# Patient Record
Sex: Female | Born: 1990 | Race: Black or African American | Hispanic: No | Marital: Single | Smoking: Current every day smoker
Health system: Southern US, Community
[De-identification: ages and names within clinical notes are randomized; demographics above are authoritative.]

## PROBLEM LIST (undated history)

## (undated) DIAGNOSIS — R011 Cardiac murmur, unspecified: Secondary | ICD-10-CM

---
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
## 2010-01-12 ENCOUNTER — Inpatient Hospital Stay (HOSPITAL_COMMUNITY): Admission: AD | Admit: 2010-01-12 | Discharge: 2010-01-12 | Payer: Self-pay | Admitting: Obstetrics & Gynecology

## 2011-02-22 LAB — URINALYSIS, ROUTINE W REFLEX MICROSCOPIC
Ketones, ur: NEGATIVE mg/dL
Nitrite: NEGATIVE
Protein, ur: NEGATIVE mg/dL

## 2011-02-22 LAB — FETAL FIBRONECTIN: Fetal Fibronectin: NEGATIVE

## 2011-03-07 LAB — URINALYSIS, ROUTINE W REFLEX MICROSCOPIC
Bilirubin Urine: NEGATIVE
Glucose, UA: NEGATIVE mg/dL
Ketones, ur: NEGATIVE mg/dL
Protein, ur: NEGATIVE mg/dL
Urobilinogen, UA: 1 mg/dL (ref 0.0–1.0)

## 2011-03-07 LAB — URINE MICROSCOPIC-ADD ON

## 2011-03-07 LAB — WET PREP, GENITAL
Trich, Wet Prep: NONE SEEN
Yeast Wet Prep HPF POC: NONE SEEN

## 2011-03-07 LAB — URINE CULTURE

## 2011-03-07 LAB — GC/CHLAMYDIA PROBE AMP, GENITAL
Chlamydia, DNA Probe: POSITIVE — AB
GC Probe Amp, Genital: NEGATIVE

## 2012-09-18 ENCOUNTER — Encounter (HOSPITAL_COMMUNITY): Payer: Self-pay | Admitting: Family Medicine

## 2012-09-18 ENCOUNTER — Emergency Department (HOSPITAL_COMMUNITY)
Admission: EM | Admit: 2012-09-18 | Discharge: 2012-09-18 | Disposition: A | Discharge status: Home / Self Care | Payer: BC Managed Care – PPO | Attending: Emergency Medicine | Admitting: Emergency Medicine

## 2012-09-18 DIAGNOSIS — R070 Pain in throat: Secondary | ICD-10-CM | POA: Insufficient documentation

## 2012-09-18 DIAGNOSIS — J029 Acute pharyngitis, unspecified: Secondary | ICD-10-CM

## 2012-09-18 DIAGNOSIS — F17200 Nicotine dependence, unspecified, uncomplicated: Secondary | ICD-10-CM | POA: Insufficient documentation

## 2012-09-18 DIAGNOSIS — R05 Cough: Secondary | ICD-10-CM | POA: Insufficient documentation

## 2012-09-18 DIAGNOSIS — R059 Cough, unspecified: Secondary | ICD-10-CM | POA: Insufficient documentation

## 2012-09-18 DIAGNOSIS — R1310 Dysphagia, unspecified: Secondary | ICD-10-CM | POA: Insufficient documentation

## 2012-09-18 DIAGNOSIS — J039 Acute tonsillitis, unspecified: Secondary | ICD-10-CM | POA: Insufficient documentation

## 2012-09-18 LAB — RAPID STREP SCREEN (MED CTR MEBANE ONLY): Streptococcus, Group A Screen (Direct): NEGATIVE

## 2012-09-18 NOTE — ED Provider Notes (Signed)
History   This chart was scribed for Richardean Canal, MD by Charolett Bumpers . The patient was seen in room TR07C/TR07C. Patient's care was started at 1305.   CSN: [id number] Arrival date & time 09/18/12  1236  First MD Initiated Contact with Patient 09/18/12 1305      Chief Complaint  Patient presents with  . Sore Throat    HPI Karen Boone is a 21 y.o. female who presents to the Emergency Department complaining of constant, moderate sore throat for the past 3 days. She reports associated mild cough and pain with swallowing. She denies any fevers, rhinorrhea. She denies any sick contacts. She denies any other medical problems. She reports a h/o strep throat with the last case a few years ago.   History reviewed. No pertinent past medical history.  History reviewed. No pertinent past surgical history.  No family history on file.  History  Substance Use Topics  . Smoking status: Current Every Day Smoker  . Smokeless tobacco: Not on file  . Alcohol Use: No    OB History    Grav Para Term Preterm Abortions TAB SAB Ect Mult Living                  Review of Systems  Constitutional: Negative for fever and chills.  HENT: Positive for sore throat and trouble swallowing. Negative for rhinorrhea.   Respiratory: Positive for cough. Negative for shortness of breath.   Gastrointestinal: Negative for nausea, vomiting and abdominal pain.  Neurological: Negative for weakness.  All other systems reviewed and are negative.    Allergies  Review of patient's allergies indicates no known allergies.  Home Medications  No current outpatient prescriptions on file.  BP 109/70  Pulse 84  Temp 98.1 F (36.7 C) (Oral)  Resp 18  SpO2 99%  LMP 09/13/2012  Physical Exam  Nursing note and vitals reviewed. Constitutional: She is oriented to person, place, and time. She appears well-developed and well-nourished. No distress.       NAD  HENT:  Head: Normocephalic and atraumatic.    Mouth/Throat: Oropharyngeal exudate present.       Tonsils are enlarged with minimal exudates.   Eyes: EOM are normal. Pupils are equal, round, and reactive to light.  Neck: Normal range of motion. Neck supple. No tracheal deviation present.  Cardiovascular: Normal rate, regular rhythm and normal heart sounds.   No murmur heard. Pulmonary/Chest: Effort normal and breath sounds normal. No respiratory distress. She has no wheezes.  Abdominal: Soft. She exhibits no distension.  Musculoskeletal: Normal range of motion. She exhibits no edema.  Lymphadenopathy:    She has no cervical adenopathy.  Neurological: She is alert and oriented to person, place, and time.  Skin: Skin is warm and dry.  Psychiatric: She has a normal mood and affect. Her behavior is normal.    ED Course  Procedures (including critical care time)  DIAGNOSTIC STUDIES: Oxygen Saturation is 99% on room air, normal by my interpretation.    COORDINATION OF CARE:  13:09-Discussed planned course of treatment with the patient including a rapid strep screen, who is agreeable at this time.    1. Sore throat   2. Tonsillitis       MDM  Karen Boone is a 21 y.o. female here with sore throat. Sore throat likely viral as the rapid strep neg. She has 1/4 Centor criteria with neg rapid strep so doesn't need abx. Recommend motrin, fluids, and salt water gargles.  Return instructions given.     This document was completed by the scribe at my direction and I have reviewed its accuracy. I have personally examined the patient and agrees with the above document.   Chaney Malling, MD     Richardean Canal, MD 09/18/12 1409

## 2012-09-18 NOTE — ED Notes (Signed)
Urine sample provided if needed. 

## 2012-09-18 NOTE — ED Notes (Signed)
Pt presents with 3 day h/o difficulty swallowing.  Pt denies any sick contact, is able to handle secretions.

## 2012-09-18 NOTE — ED Notes (Signed)
Pt sts sore throat x 3 days  

## 2013-08-26 ENCOUNTER — Inpatient Hospital Stay (HOSPITAL_COMMUNITY)
Admission: AD | Admit: 2013-08-26 | Discharge: 2013-08-27 | Disposition: A | Discharge status: Home / Self Care | Payer: BC Managed Care – PPO | Source: Ambulatory Visit | Attending: Obstetrics and Gynecology | Admitting: Obstetrics and Gynecology

## 2013-08-26 ENCOUNTER — Encounter (HOSPITAL_COMMUNITY): Payer: Self-pay

## 2013-08-26 DIAGNOSIS — R109 Unspecified abdominal pain: Secondary | ICD-10-CM | POA: Insufficient documentation

## 2013-08-26 DIAGNOSIS — K529 Noninfective gastroenteritis and colitis, unspecified: Secondary | ICD-10-CM

## 2013-08-26 DIAGNOSIS — K5289 Other specified noninfective gastroenteritis and colitis: Secondary | ICD-10-CM | POA: Insufficient documentation

## 2013-08-26 HISTORY — DX: Cardiac murmur, unspecified: R01.1

## 2013-08-27 DIAGNOSIS — K5289 Other specified noninfective gastroenteritis and colitis: Secondary | ICD-10-CM

## 2013-08-27 LAB — URINALYSIS, ROUTINE W REFLEX MICROSCOPIC
Bilirubin Urine: NEGATIVE
Ketones, ur: NEGATIVE mg/dL
Leukocytes, UA: NEGATIVE
Nitrite: NEGATIVE
Specific Gravity, Urine: >1.03 — ABNORMAL HIGH (ref 1.005–1.030)
Urobilinogen, UA: 0.2 mg/dL (ref 0.0–1.0)
pH: 6 (ref 5.0–8.0)

## 2013-08-27 LAB — CBC
HCT: 33.7 % — ABNORMAL LOW (ref 36.0–46.0)
MCH: 31.9 pg (ref 26.0–34.0)
MCV: 93.4 fL (ref 78.0–100.0)
RBC: 3.61 MIL/uL — ABNORMAL LOW (ref 3.87–5.11)
WBC: 8.3 10*3/uL (ref 4.0–10.5)

## 2013-08-27 LAB — URINE MICROSCOPIC-ADD ON

## 2013-08-27 LAB — WET PREP, GENITAL: WBC, Wet Prep HPF POC: NONE SEEN

## 2013-08-27 MED ORDER — ONDANSETRON HCL 4 MG PO TABS: 4.0000 mg | ORAL_TABLET | Freq: Three times a day (TID) | ORAL | Status: DC | PRN

## 2013-08-27 MED ORDER — KETOROLAC TROMETHAMINE 60 MG/2ML IM SOLN
60.0000 mg | Freq: Once | INTRAMUSCULAR | Status: AC
  Administered 2013-08-27: 60 mg via INTRAMUSCULAR
  Filled 2013-08-27: qty 2

## 2013-08-27 MED ORDER — ONDANSETRON 8 MG PO TBDP
8.0000 mg | ORAL_TABLET | Freq: Once | ORAL | Status: AC
  Administered 2013-08-27: 8 mg via ORAL
  Filled 2013-08-27: qty 1

## 2013-08-27 NOTE — MAU Provider Note (Signed)
Chief Complaint: Abdominal Pain   First Provider Initiated Contact with Patient 08/27/13 0038     SUBJECTIVE HPI: Karen Boone is a 22 y.o. G43P1001 female who presents with sharp, intermittent generalized abdominal cramping since yesterday (lower abdomen more than upper abdomen), vomited x3 yesterday and continued nausea today. Thinks she may have food poisoning. Denies consuming any unpasteurized dairy, undercooked meats. Patient's last menstrual period was 08/25/2013. Regular cycles. No history of similar pain. Has not taken anything for the pain due to concerns that it would exacerbate the nausea. Has not taken anything for nausea because she did not have any medication. No further vomiting since starting liquid diet this morning. Patient states she is in a mutually monogamous relationship x1 year.  Past Medical History  Diagnosis Date  . Heart murmur    OB History  Gravida Para Term Preterm AB SAB TAB Ectopic Multiple Living  1 1 1       1     # Outcome Date GA Lbr Len/2nd Weight Sex Delivery Anes PTL Lv  1 TRM 03/29/10 [redacted]w[redacted]d   M LTCS  N Y     History reviewed. No pertinent past surgical history. History   Social History  . Marital Status: Single    Spouse Name: N/A    Number of Children: N/A  . Years of Education: N/A   Occupational History  . Not on file.   Social History Main Topics  . Smoking status: Current Some Day Smoker    Types: Cigarettes  . Smokeless tobacco: Not on file  . Alcohol Use: Yes  . Drug Use: Yes    Special: Marijuana  . Sexual Activity: Yes    Birth Control/ Protection: None   Other Topics Concern  . Not on file   Social History Narrative  . No narrative on file   No current facility-administered medications on file prior to encounter.   No current outpatient prescriptions on file prior to encounter.   No Known Allergies  ROS: Pertinent positive items in HPI. Negative for fever, chills, diarrhea, constipation, sick contacts, urinary  complaints, vaginal discharge, intermenstrual bleeding. Last bowel movement yesterday.  OBJECTIVE Blood pressure 123/76, pulse 75, temperature 98.2 F (36.8 C), temperature source Oral, resp. rate 18, height 5\' 4"  (1.626 m), weight 77.928 kg (171 lb 12.8 oz), last menstrual period 08/25/2013. GENERAL: Well-developed, well-nourished female in no acute distress.  HEENT: Normocephalic HEART: normal rate RESP: normal effort ABDOMEN: Soft, mild-moderate tenderness throughout entire lower half of the abdomen. Negative guarding. Negative rebound. Negative mass. Positive bowel sounds. EXTREMITIES: Nontender, no edema NEURO: Alert and oriented SPECULUM EXAM: NEFG, small amount of dark red blood noted, normal odor. cervix clean BIMANUAL: cervix closed; uterus normal size, no adnexal tenderness or masses. No cervical motion tenderness.  LAB RESULTS Results for orders placed during the hospital encounter of 08/26/13 (from the past 24 hour(s))  URINALYSIS, ROUTINE W REFLEX MICROSCOPIC     Status: Abnormal   Collection Time    08/26/13 11:21 PM      Result Value Range   Color, Urine YELLOW  YELLOW   APPearance CLEAR  CLEAR   Specific Gravity, Urine >1.030 (*) 1.005 - 1.030   pH 6.0  5.0 - 8.0   Glucose, UA NEGATIVE  NEGATIVE mg/dL   Hgb urine dipstick MODERATE (*) NEGATIVE   Bilirubin Urine NEGATIVE  NEGATIVE   Ketones, ur NEGATIVE  NEGATIVE mg/dL   Protein, ur NEGATIVE  NEGATIVE mg/dL   Urobilinogen, UA 0.2  0.0 - 1.0 mg/dL   Nitrite NEGATIVE  NEGATIVE   Leukocytes, UA NEGATIVE  NEGATIVE  URINE MICROSCOPIC-ADD ON     Status: None   Collection Time    08/26/13 11:21 PM      Result Value Range   WBC, UA 0-2  <3 WBC/hpf   RBC / HPF 0-2  <3 RBC/hpf   Bacteria, UA RARE  RARE   Urine-Other MUCOUS PRESENT    POCT PREGNANCY, URINE     Status: None   Collection Time    08/26/13 11:35 PM      Result Value Range   Preg Test, Ur NEGATIVE  NEGATIVE  CBC     Status: Abnormal   Collection Time     08/27/13 12:40 AM      Result Value Range   WBC 8.3  4.0 - 10.5 K/uL   RBC 3.61 (*) 3.87 - 5.11 MIL/uL   Hemoglobin 11.5 (*) 12.0 - 15.0 g/dL   HCT 16.1 (*) 09.6 - 04.5 %   MCV 93.4  78.0 - 100.0 fL   MCH 31.9  26.0 - 34.0 pg   MCHC 34.1  30.0 - 36.0 g/dL   RDW 40.9  81.1 - 91.4 %   Platelets 163  150 - 400 K/uL  WET PREP, GENITAL     Status: Abnormal   Collection Time    08/27/13 12:45 AM      Result Value Range   Yeast Wet Prep HPF POC NONE SEEN  NONE SEEN   Trich, Wet Prep NONE SEEN  NONE SEEN   Clue Cells Wet Prep HPF POC FEW (*) NONE SEEN   WBC, Wet Prep HPF POC NONE SEEN  NONE SEEN    IMAGING No results found.  MAU COURSE Toradol and Zofran given. Pain and nausea resolved.   ASSESSMENT 1. Acute gastroenteritis    PLAN Discharge home in stable condition.     Follow-up Information   Follow up with Primary care provider. (As needed if no improvement in nausea and vomiting in the next 2-3 days.)       Follow up with COUSINS,SHERONETTE A, MD. (As needed for routine gynecology care)    Specialty:  Obstetrics and Gynecology   Contact information:   [address] [place] [address] [phone]        Medication List         ondansetron 4 MG tablet  Commonly known as:  ZOFRAN  Take 1 tablet (4 mg total) by mouth every 8 (eight) hours as needed for nausea.         [place], CNM 08/27/2013  2:15 AM

## 2013-08-28 LAB — GC/CHLAMYDIA PROBE AMP
CT Probe RNA: NEGATIVE
GC Probe RNA: NEGATIVE

## 2013-09-01 NOTE — MAU Provider Note (Signed)
Attestation of Attending Supervision of Advanced Practitioner: Evaluation and management procedures were performed by the PA/NP/CNM/OB Fellow under my supervision/collaboration. Chart reviewed and agree with management and plan.  Kaylah Chiasson V 09/01/2013 11:37 AM

## 2014-02-25 ENCOUNTER — Encounter (HOSPITAL_COMMUNITY): Payer: Self-pay | Admitting: Emergency Medicine

## 2014-02-25 DIAGNOSIS — R112 Nausea with vomiting, unspecified: Secondary | ICD-10-CM | POA: Insufficient documentation

## 2014-02-25 DIAGNOSIS — R1084 Generalized abdominal pain: Secondary | ICD-10-CM | POA: Insufficient documentation

## 2014-02-25 DIAGNOSIS — R011 Cardiac murmur, unspecified: Secondary | ICD-10-CM | POA: Insufficient documentation

## 2014-02-25 DIAGNOSIS — K5900 Constipation, unspecified: Secondary | ICD-10-CM | POA: Insufficient documentation

## 2014-02-25 DIAGNOSIS — F17200 Nicotine dependence, unspecified, uncomplicated: Secondary | ICD-10-CM | POA: Insufficient documentation

## 2014-02-25 LAB — CBC WITH DIFFERENTIAL/PLATELET
BASOS PCT: 0 % (ref 0–1)
Basophils Absolute: 0 10*3/uL (ref 0.0–0.1)
EOS ABS: 0.1 10*3/uL (ref 0.0–0.7)
Eosinophils Relative: 1 % (ref 0–5)
HEMATOCRIT: 34.9 % — AB (ref 36.0–46.0)
HEMOGLOBIN: 12.3 g/dL (ref 12.0–15.0)
Lymphocytes Relative: 25 % (ref 12–46)
Lymphs Abs: 2.3 10*3/uL (ref 0.7–4.0)
MCH: 33 pg (ref 26.0–34.0)
MCHC: 35.2 g/dL (ref 30.0–36.0)
MCV: 93.6 fL (ref 78.0–100.0)
MONO ABS: 0.4 10*3/uL (ref 0.1–1.0)
MONOS PCT: 5 % (ref 3–12)
Neutro Abs: 6.2 10*3/uL (ref 1.7–7.7)
Neutrophils Relative %: 69 % (ref 43–77)
Platelets: 173 10*3/uL (ref 150–400)
RBC: 3.73 MIL/uL — ABNORMAL LOW (ref 3.87–5.11)
RDW: 12.7 % (ref 11.5–15.5)
WBC: 8.9 10*3/uL (ref 4.0–10.5)

## 2014-02-25 LAB — POC URINE PREG, ED: PREG TEST UR: NEGATIVE

## 2014-02-25 LAB — COMPREHENSIVE METABOLIC PANEL
ALBUMIN: 3.8 g/dL (ref 3.5–5.2)
ALT: 10 U/L (ref 0–35)
AST: 14 U/L (ref 0–37)
Alkaline Phosphatase: 43 U/L (ref 39–117)
BILIRUBIN TOTAL: 0.5 mg/dL (ref 0.3–1.2)
BUN: 9 mg/dL (ref 6–23)
CO2: 25 mEq/L (ref 19–32)
CREATININE: 0.52 mg/dL (ref 0.50–1.10)
Calcium: 9.4 mg/dL (ref 8.4–10.5)
Chloride: 105 mEq/L (ref 96–112)
GFR calc non Af Amer: >90 mL/min (ref >90)
GLUCOSE: 110 mg/dL — AB (ref 70–99)
Potassium: 3.8 mEq/L (ref 3.7–5.3)
Sodium: 143 mEq/L (ref 137–147)
TOTAL PROTEIN: 7.1 g/dL (ref 6.0–8.3)

## 2014-02-25 LAB — URINALYSIS, ROUTINE W REFLEX MICROSCOPIC
Bilirubin Urine: NEGATIVE
Glucose, UA: NEGATIVE mg/dL
Hgb urine dipstick: NEGATIVE
Ketones, ur: NEGATIVE mg/dL
Nitrite: NEGATIVE
PROTEIN: NEGATIVE mg/dL
Specific Gravity, Urine: 1.029 (ref 1.005–1.030)
UROBILINOGEN UA: 0.2 mg/dL (ref 0.0–1.0)
pH: 7.5 (ref 5.0–8.0)

## 2014-02-25 LAB — LIPASE, BLOOD: LIPASE: 30 U/L (ref 11–59)

## 2014-02-25 LAB — URINE MICROSCOPIC-ADD ON

## 2014-02-25 NOTE — ED Notes (Signed)
Pt. reports generalized abdominal pain with nausea and vomitting onset last week . Slight constipation , no fever or chills.

## 2014-02-26 ENCOUNTER — Emergency Department (HOSPITAL_COMMUNITY): Payer: BC Managed Care – PPO

## 2014-02-26 ENCOUNTER — Emergency Department (HOSPITAL_COMMUNITY)
Admission: EM | Admit: 2014-02-26 | Discharge: 2014-02-26 | Disposition: A | Discharge status: Home / Self Care | Payer: BC Managed Care – PPO | Attending: Emergency Medicine | Admitting: Emergency Medicine

## 2014-02-26 ENCOUNTER — Emergency Department (HOSPITAL_COMMUNITY)
Admission: EM | Admit: 2014-02-26 | Discharge: 2014-02-26 | Discharge status: Against Medical Advice | Payer: BC Managed Care – PPO | Attending: Emergency Medicine | Admitting: Emergency Medicine

## 2014-02-26 ENCOUNTER — Encounter (HOSPITAL_COMMUNITY): Payer: Self-pay | Admitting: Emergency Medicine

## 2014-02-26 DIAGNOSIS — N760 Acute vaginitis: Secondary | ICD-10-CM | POA: Insufficient documentation

## 2014-02-26 DIAGNOSIS — B9689 Other specified bacterial agents as the cause of diseases classified elsewhere: Secondary | ICD-10-CM | POA: Insufficient documentation

## 2014-02-26 DIAGNOSIS — R109 Unspecified abdominal pain: Secondary | ICD-10-CM

## 2014-02-26 DIAGNOSIS — N83202 Unspecified ovarian cyst, left side: Secondary | ICD-10-CM

## 2014-02-26 DIAGNOSIS — N83209 Unspecified ovarian cyst, unspecified side: Secondary | ICD-10-CM | POA: Insufficient documentation

## 2014-02-26 DIAGNOSIS — F17200 Nicotine dependence, unspecified, uncomplicated: Secondary | ICD-10-CM | POA: Insufficient documentation

## 2014-02-26 DIAGNOSIS — R011 Cardiac murmur, unspecified: Secondary | ICD-10-CM | POA: Insufficient documentation

## 2014-02-26 DIAGNOSIS — Z3202 Encounter for pregnancy test, result negative: Secondary | ICD-10-CM | POA: Insufficient documentation

## 2014-02-26 DIAGNOSIS — A499 Bacterial infection, unspecified: Secondary | ICD-10-CM | POA: Insufficient documentation

## 2014-02-26 LAB — WET PREP, GENITAL
Trich, Wet Prep: NONE SEEN
YEAST WET PREP: NONE SEEN

## 2014-02-26 LAB — POC URINE PREG, ED: PREG TEST UR: NEGATIVE

## 2014-02-26 MED ORDER — METRONIDAZOLE 500 MG PO TABS: 500.0000 mg | ORAL_TABLET | Freq: Two times a day (BID) | ORAL | Status: DC

## 2014-02-26 MED ORDER — KETOROLAC TROMETHAMINE 30 MG/ML IJ SOLN
30.0000 mg | Freq: Once | INTRAMUSCULAR | Status: AC
  Administered 2014-02-26: 30 mg via INTRAVENOUS
  Filled 2014-02-26: qty 1

## 2014-02-26 MED ORDER — NAPROXEN 500 MG PO TABS: 500.0000 mg | ORAL_TABLET | Freq: Two times a day (BID) | ORAL | Status: DC

## 2014-02-26 MED ORDER — MORPHINE SULFATE 4 MG/ML IJ SOLN: 4.0000 mg | Freq: Once | INTRAMUSCULAR | Status: DC

## 2014-02-26 MED ORDER — SODIUM CHLORIDE 0.9 % IV BOLUS (SEPSIS)
1000.0000 mL | Freq: Once | INTRAVENOUS | Status: AC
  Administered 2014-02-26: 1000 mL via INTRAVENOUS

## 2014-02-26 NOTE — ED Notes (Signed)
Pt name called with no answer 

## 2014-02-26 NOTE — ED Provider Notes (Signed)
CSN: [id number]     Arrival date & time 02/26/14  1244 History   First MD Initiated Contact with Patient 02/26/14 1440     Chief Complaint  Patient presents with  . Abdominal Pain  . Emesis     (Consider location/radiation/quality/duration/timing/severity/associated sxs/prior Treatment) Patient is a 23 y.o. female presenting with abdominal pain and vomiting.  Abdominal Pain Associated symptoms: vomiting   Emesis Associated symptoms: abdominal pain   23 yo female with 1 week of abdominal pain with associated associated with eating. N/V x 3 episodes earlier in the week. Admits to 1 episode of loose stools this morning. Describes an intermittent crampy abdominal pain that comes and goes rated at 9/10. PMH significant for C-section. Patietn also admits to vaginal discharge. Patietn does have new partner that is a female but her ex was a female who cheated on her. Reports LMP 2 weeks ago. Not currently on contraception.  Patient had lab work yesterday but left prior to being seen.   Past Medical History  Diagnosis Date  . Heart murmur    History reviewed. No pertinent past surgical history. History reviewed. No pertinent family history. History  Substance Use Topics  . Smoking status: Current Some Day Smoker    Types: Cigarettes  . Smokeless tobacco: Not on file  . Alcohol Use: Yes   OB History   Grav Para Term Preterm Abortions TAB SAB Ect Mult Living   1 1 1       1      Review of Systems  Gastrointestinal: Positive for vomiting and abdominal pain.  All other systems reviewed and are negative.      Allergies  Review of patient's allergies indicates no known allergies.  Home Medications   Current Outpatient Rx  Name  Route  Sig  Dispense  Refill  . metroNIDAZOLE (FLAGYL) 500 MG tablet   Oral   Take 1 tablet (500 mg total) by mouth 2 (two) times daily. One po bid x 7 days   14 tablet   0   . naproxen (NAPROSYN) 500 MG tablet   Oral   Take 1 tablet (500 mg total) by  mouth 2 (two) times daily with a meal.   30 tablet   0    BP 107/65  Pulse 65  Temp(Src) 99 F (37.2 C) (Oral)  Resp 18  Wt 168 lb 4 oz (76.318 kg)  SpO2 100%  LMP 02/10/2014 Physical Exam  Nursing note and vitals reviewed. Constitutional: She is oriented to person, place, and time. She appears well-developed and well-nourished. No distress.  HENT:  Head: Normocephalic and atraumatic.  Eyes: Conjunctivae are normal. No scleral icterus.  Neck: No JVD present. No tracheal deviation present.  Cardiovascular: Normal rate and regular rhythm.  Exam reveals no gallop and no friction rub.   No murmur heard. Pulmonary/Chest: Effort normal and breath sounds normal. No respiratory distress. She has no wheezes. She has no rhonchi. She has no rales.  Abdominal: Soft. Normal appearance and bowel sounds are normal. She exhibits no distension. There is no hepatosplenomegaly. There is tenderness in the suprapubic area. There is no rigidity, no rebound, no guarding, no CVA tenderness, no tenderness at McBurney's point and negative Murphy's sign.  Genitourinary: Vagina normal. Pelvic exam was performed with patient supine. There is no rash, tenderness or lesion on the right labia. There is no rash, tenderness or lesion on the left labia. Cervix exhibits discharge (yellow serous discharge. ). Cervix exhibits no motion tenderness and no  friability. Right adnexum displays no mass, no tenderness and no fullness. Left adnexum displays no mass, no tenderness and no fullness.  Musculoskeletal: She exhibits no edema.  Neurological: She is alert and oriented to person, place, and time.  Skin: Skin is warm and dry. She is not diaphoretic.  Psychiatric: She has a normal mood and affect. Her behavior is normal.    ED Course  Procedures (including critical care time) Labs Review Labs Reviewed  GC/CHLAMYDIA PROBE AMP - Abnormal; Notable for the following:    CT Probe RNA POSITIVE (*)    All other components  within normal limits  WET PREP, GENITAL - Abnormal; Notable for the following:    Clue Cells Wet Prep HPF POC MODERATE (*)    WBC, Wet Prep HPF POC TOO NUMEROUS TO COUNT (*)    All other components within normal limits  POC URINE PREG, ED   Imaging Review [place] Transvaginal Non-ob  02/26/2014   CLINICAL DATA:  Abdominal pain.  EXAM: TRANSABDOMINAL AND TRANSVAGINAL ULTRASOUND OF PELVIS  TECHNIQUE: Both transabdominal and transvaginal ultrasound examinations of the pelvis were performed. Transabdominal technique was performed for global imaging of the pelvis including uterus, ovaries, adnexal regions, and pelvic cul-de-sac. It was necessary to proceed with endovaginal exam following the transabdominal exam to visualize the uterus and ovaries.  COMPARISON:  None  FINDINGS: Uterus  Measurements: 10.4 x 5.0 x 5.5 cm. No fibroids or other mass visualized.  Endometrium  Thickness: 0.4 cm.  No focal abnormality visualized.  Right ovary  Measurements: 3.9 x 1.9 x 2.2 cm. There is an elongated hypoechoic structure in the right ovary which could represent a resolving cyst.  Left ovary  Measurements: 3.7 x 2.7 x 3.2 cm. There is a round hypoechoic structure with acoustic enhancement in the left ovary. This structure measures up to 2.5 cm and compatible with a small cyst.  Other findings  No free fluid.  IMPRESSION: No acute abnormality.  Small left ovarian cyst.   Electronically Signed   By: Richarda OverlieAdam  Henn M.D.   On: 02/26/2014 19:20   [place] Pelvis Complete  02/26/2014   CLINICAL DATA:  Abdominal pain.  EXAM: TRANSABDOMINAL AND TRANSVAGINAL ULTRASOUND OF PELVIS  TECHNIQUE: Both transabdominal and transvaginal ultrasound examinations of the pelvis were performed. Transabdominal technique was performed for global imaging of the pelvis including uterus, ovaries, adnexal regions, and pelvic cul-de-sac. It was necessary to proceed with endovaginal exam following the transabdominal exam to visualize the uterus and ovaries.   COMPARISON:  None  FINDINGS: Uterus  Measurements: 10.4 x 5.0 x 5.5 cm. No fibroids or other mass visualized.  Endometrium  Thickness: 0.4 cm.  No focal abnormality visualized.  Right ovary  Measurements: 3.9 x 1.9 x 2.2 cm. There is an elongated hypoechoic structure in the right ovary which could represent a resolving cyst.  Left ovary  Measurements: 3.7 x 2.7 x 3.2 cm. There is a round hypoechoic structure with acoustic enhancement in the left ovary. This structure measures up to 2.5 cm and compatible with a small cyst.  Other findings  No free fluid.  IMPRESSION: No acute abnormality.  Small left ovarian cyst.   Electronically Signed   By: Richarda OverlieAdam  Henn M.D.   On: 02/26/2014 19:20     EKG Interpretation None      MDM   Final diagnoses:  Abdominal pain  Left ovarian cyst  BV (bacterial vaginosis)   Patient afebrile with normal VS.  Patient seen yesterday evening, had lab work  drawn but left prior to being seen. Lab work from yesterday appears WNL.  Wet prep positive for clue cells, suggestive of BV. Will treat with flagyl.  G/C pending. Will contact patient in the morning of her results.  US shows Small left ovarian cyst, without any additional acute abnormality.  Discussed labs, and exam findings with patient. Plan [facility] have [facility]. 1 month after next period for repeat ultrasound for further evaluation ovarian cyst. Patient agrees with plan. Discharged in good condition.   Meds given in ED:  Medications  sodium chloride 0.9 % bolus 1,000 mL (0 mLs Intravenous Stopped 02/26/14 1711)  ketorolac (TORADOL) 30 MG/ML injection 30 mg (30 mg Intravenous Given 02/26/14 1647)    Discharge Medication List as of 02/26/2014  7:48 PM    START taking these medications   Details  metroNIDAZOLE (FLAGYL) 500 MG tablet Take 1 tablet (500 mg total) by mouth 2 (two) times daily. One po bid x 7 days, Starting 02/26/2014, Until Discontinued, Print    naproxen  (NAPROSYN) 500 MG tablet Take 1 tablet (500 mg total) by mouth 2 (two) times daily with a meal., Starting 02/26/2014, Until Discontinued, Print             Rudene Anda, PA-C 02/27/14 1214

## 2014-02-26 NOTE — ED Notes (Signed)
Pt reports she has been abd pain and vomiting that started about a week ago. States that she was seen here last night but left without being seen. Reports that she vomited again this morning. Denies any urinary symptoms.

## 2014-02-26 NOTE — ED Notes (Signed)
Pt had labs any urine done last night.

## 2014-02-26 NOTE — Discharge Instructions (Signed)
Take Flagyl (Metronidazole) as directed for your bacterial infection and Naprosyn for your lower abdominal pain likely related to the ovarian cyst. Follow up with your OBGYN for further management of ovarian cyst. Recommend repeat ultrasound the one month after your next period.    Abdominal Pain, Adult Many things can cause belly (abdominal) pain. Most times, the belly pain is not dangerous. Many cases of belly pain can be watched and treated at home. HOME CARE   Do not take medicines that help you go poop (laxatives) unless told to by your doctor.  Only take medicine as told by your doctor.  Eat or drink as told by your doctor. Your doctor will tell you if you should be on a special diet. GET HELP IF:  You do not know what is causing your belly pain.  You have belly pain while you are sick to your stomach (nauseous) or have runny poop (diarrhea).  You have pain while you pee or poop.  Your belly pain wakes you up at night.  You have belly pain that gets worse or better when you eat.  You have belly pain that gets worse when you eat fatty foods. GET HELP RIGHT AWAY IF:   The pain does not go away within 2 hours.  You have a fever.  You keep throwing up (vomiting).  The pain changes and is only in the right or left part of the belly.  You have bloody or tarry looking poop. MAKE SURE YOU:   Understand these instructions.  Will watch your condition.  Will get help right away if you are not doing well or get worse. Document Released: 05/07/2008 Document Revised: 09/09/2013 Document Reviewed: 07/29/2013 [facility] Patient Information 2014 [place], [place].  Bacterial Vaginosis Bacterial vaginosis is an infection of the vagina. It happens when too many of certain germs (bacteria) grow in the vagina. HOME CARE  Take your medicine as told by your doctor.  Finish your medicine even if you start to feel better.  Do not have sex until you finish your medicine and are  better.  Tell your sex partner that you have an infection. They should see their doctor for treatment.  Practice safe sex. Use condoms. Have only one sex partner. GET HELP IF:  You are not getting better after 3 days of treatment.  You have more grey fluid (discharge) coming from your vagina than before.  You have more pain than before.  You have a fever. MAKE SURE YOU:   Understand these instructions.  Will watch your condition.  Will get help right away if you are not doing well or get worse. Document Released: 08/28/2008 Document Revised: 09/09/2013 Document Reviewed: 07/01/2013 [facility] Patient Information 2014 [place], [place].  Ovarian Cyst An ovarian cyst is a sac filled with fluid or blood. This sac is attached to the ovary. Some cysts go away on their own. Other cysts need treatment.  HOME CARE   Only take medicine as told by your doctor.  Follow up with your doctor as told.  Get regular pelvic exams and Pap tests. GET HELP IF:  Your periods are late, not regular, or painful.  You stop having periods.  Your belly (abdominal) or pelvic pain does not go away.  Your belly becomes large or puffy (swollen).  You have a hard time peeing (totally emptying your bladder).  You have pressure on your bladder.  You have pain during sex.  You feel fullness, pressure, or discomfort in your belly.  You  lose weight for no reason.  You feel sick most of the time.  You have a hard time pooping (constipation).  You do not feel like eating.  You develop pimples (acne).  You have an increase in hair on your body and face.  You are gaining weight for no reason.  You think you are pregnant. GET HELP RIGHT AWAY IF:   Your belly pain gets worse.  You feel sick to your stomach (nauseous), and you throw up (vomit).  You have a fever that comes on fast.  You have belly pain while pooping (bowel movement).  Your periods are heavier than usual. MAKE SURE YOU:    Understand these instructions.  Will watch your condition.  Will get help right away if you are not doing well or get worse. Document Released: 05/07/2008 Document Revised: 09/09/2013 Document Reviewed: 07/27/2013 [facility] Patient Information 2014 [place], [place].

## 2014-02-27 ENCOUNTER — Telehealth (HOSPITAL_COMMUNITY): Payer: Self-pay | Admitting: Emergency Medicine

## 2014-02-27 LAB — GC/CHLAMYDIA PROBE AMP
CT Probe RNA: POSITIVE — AB
GC PROBE AMP APTIMA: NEGATIVE

## 2014-02-27 MED ORDER — AZITHROMYCIN 250 MG PO TABS: ORAL_TABLET | ORAL | Status: DC

## 2014-02-27 NOTE — ED Notes (Signed)
Patient seen in ED last night by me for abdominal pain. Patient dx with BV from results of wet prep, and started on flagyl. Informed patient that G/C results would return today. Patient contacted by phone and informed of her positive results for Chlamydia. Discussed treatment with azithromycin 250mg , 4 tablets PO x 1 dose. Patient agrees with plan. [facility] [address] [place], [place]. Electronic rx sent to [facility] [address] for patient to pick up. Patient denies any further questions at this time.     Rudene AndaJacob Gray Darry Kelnhofer, PA-C 02/27/14 1207

## 2014-02-28 NOTE — Telephone Encounter (Signed)
+  Chlamydia. Per telephone encounter note, patient informed by Leeanne RioLackey PA-C of +result and new Rx. Rx for [facility]ax called [facility] to [facility] [address] by Rite AidLackey PA-C.

## 2014-02-28 NOTE — ED Provider Notes (Signed)
Medical screening examination/treatment/procedure(s) were conducted as a shared visit with non-physician practitioner(s) and myself.  I personally evaluated the patient during the encounter.   EKG Interpretation None      Pt seen and examined.  Care reviewed with PA.  Non peritoneal abdomen.  Agree with treatment plan.  Rolland PorterMark Immaculate Crutcher, MD 02/28/14 [phone]

## 2014-07-30 ENCOUNTER — Emergency Department (HOSPITAL_COMMUNITY)
Admission: EM | Admit: 2014-07-30 | Discharge: 2014-07-30 | Disposition: A | Discharge status: Home / Self Care | Payer: BC Managed Care – PPO | Attending: Emergency Medicine | Admitting: Emergency Medicine

## 2014-07-30 ENCOUNTER — Encounter (HOSPITAL_COMMUNITY): Payer: Self-pay | Admitting: Emergency Medicine

## 2014-07-30 DIAGNOSIS — F17200 Nicotine dependence, unspecified, uncomplicated: Secondary | ICD-10-CM | POA: Insufficient documentation

## 2014-07-30 DIAGNOSIS — J029 Acute pharyngitis, unspecified: Secondary | ICD-10-CM | POA: Insufficient documentation

## 2014-07-30 DIAGNOSIS — Z791 Long term (current) use of non-steroidal anti-inflammatories (NSAID): Secondary | ICD-10-CM | POA: Insufficient documentation

## 2014-07-30 DIAGNOSIS — R011 Cardiac murmur, unspecified: Secondary | ICD-10-CM | POA: Insufficient documentation

## 2014-07-30 DIAGNOSIS — Z792 Long term (current) use of antibiotics: Secondary | ICD-10-CM | POA: Insufficient documentation

## 2014-07-30 LAB — RAPID STREP SCREEN (MED CTR MEBANE ONLY): Streptococcus, Group A Screen (Direct): NEGATIVE

## 2014-07-30 MED ORDER — PENICILLIN G BENZATHINE 1200000 UNIT/2ML IM SUSP
1.2000 10*6.[IU] | Freq: Once | INTRAMUSCULAR | Status: AC
  Administered 2014-07-30: 1.2 10*6.[IU] via INTRAMUSCULAR
  Filled 2014-07-30: qty 2

## 2014-07-30 MED ORDER — DEXAMETHASONE SODIUM PHOSPHATE 4 MG/ML IJ SOLN
4.0000 mg | Freq: Once | INTRAMUSCULAR | Status: AC
  Administered 2014-07-30: 4 mg via INTRAMUSCULAR
  Filled 2014-07-30: qty 1

## 2014-07-30 MED ORDER — PENICILLIN V POTASSIUM 500 MG PO TABS: 500.0000 mg | ORAL_TABLET | Freq: Four times a day (QID) | ORAL | Status: AC

## 2014-07-30 NOTE — ED Provider Notes (Signed)
Medical screening examination/treatment/procedure(s) were performed by non-physician practitioner and as supervising physician I was immediately available for consultation/collaboration.   EKG Interpretation None        Lollie Gunner N Janissa Bertram, DO 07/30/14 2124 

## 2014-07-30 NOTE — ED Notes (Signed)
Pt states she has had a sore throat since the middle of last week. It began with an itchy throat and she thought it was the beginning of strep throat so she began a leftover antibiotic prescription from an unrelated illness. She stated it gave relief but now she no longer has any. No n/v. Upon visualization, tonsils are swollen 3+. No SOB noticed. No increased work of breathing. Denies fever.

## 2014-07-30 NOTE — Progress Notes (Signed)
[facility] Community Liaison Stacy, ° °Provided pt with a list of primary care resources to help patient establish a pcp.  °

## 2014-07-30 NOTE — ED Provider Notes (Signed)
CSN: [id number]     Arrival date & time 07/30/14  1344 History  This chart was scribed for non-physician practitioner, Jaynie Crumble, PA-C working with Merrie Roof, *, by Jarvis Morgan, ED Scribe. This patient was seen in room WTR9/WTR9 and the patient's care was started at 4:15 PM.    Chief Complaint  Patient presents with  . Sore Throat     The history is provided by the patient. No language interpreter was used.   HPI Comments: Karen Boone is a 23 y.o. female who presents to the Emergency Department complaining of a constant, gradually worsening, sore throat for 1.5 weeks. She states it began with a scratchy throat and she thought maybe it was the beginning of a strep throat so she began a leftover antibiotic prescription, metronidazole from an unrelated illness. She notes she had initial relief from the antibiotics but her sore throat came back. She reports she gets strep throat yearly. She states she is having associated neck pain. She states that the sore throat is exacerbated with swallowing and when she turns her head. She has no h/o mono. She denies any fever, neck stiffness, bilateral ear pain, HA, nausea, abdominal pain, nausea, shortness of breath, or cough.   Past Medical History  Diagnosis Date  . Heart murmur    Past Surgical History  Procedure Laterality Date  . Cesarean section N/A 2011   No family history on file. History  Substance Use Topics  . Smoking status: Current Every Day Smoker -- 0.50 packs/day    Types: Cigarettes  . Smokeless tobacco: Not on file  . Alcohol Use: Yes     Comment: socially    OB History   Grav Para Term Preterm Abortions TAB SAB Ect Mult Living   Review of Systems  Constitutional: Negative for fever and chills.  HENT: Positive for sore throat and trouble swallowing (pain when swallowing). Negative for ear pain.   Respiratory: Negative for cough and shortness of breath.   Gastrointestinal:  Negative for nausea, vomiting and abdominal pain.  Musculoskeletal: Positive for neck pain. Negative for neck stiffness.  Neurological: Negative for headaches.  All other systems reviewed and are negative.     Allergies  Review of patient's allergies indicates no known allergies.  Home Medications   Prior to Admission medications   Medication Sig Start Date End Date Taking? Authorizing Provider  azithromycin (ZITHROMAX) 250 MG tablet Take all 4 tablets (1000 mg total) by mouth once. 02/27/14   Rudene Anda, PA-C  metroNIDAZOLE (FLAGYL) 500 MG tablet Take 1 tablet (500 mg total) by mouth 2 (two) times daily. One po bid x 7 days 02/26/14   Rudene Anda, PA-C  naproxen (NAPROSYN) 500 MG tablet Take 1 tablet (500 mg total) by mouth 2 (two) times daily with a meal. 02/26/14   Rudene Anda, PA-C   Triage Vitals: BP 116/56  Pulse 87  Temp(Src) 98.5 F (36.9 C) (Oral)  Resp 16  SpO2 100%  LMP 07/03/2014  Physical Exam  Nursing note and vitals reviewed. Constitutional: She is oriented to person, place, and time. She appears well-developed and well-nourished. No distress.  HENT:  Head: Normocephalic and atraumatic.  Bilateral tonsillar enlargement. Tonsils are erythematous, with exudate. Uvula mdiline  Eyes: Conjunctivae and EOM are normal.  Neck: Normal range of motion. Neck supple. No tracheal deviation present.  Bilateral cervical lymphadenopathy, tender.  Cardiovascular: Normal rate, regular rhythm and normal heart sounds.   Pulmonary/Chest: Effort normal and breath sounds normal. No respiratory distress. She has no wheezes. She has no rales. She exhibits no tenderness.  Musculoskeletal: Normal range of motion.  Neurological: She is alert and oriented to person, place, and time.  Skin: Skin is warm and dry.  Psychiatric: She has a normal mood and affect. Her behavior is normal.    ED Course  Procedures (including critical care time)  DIAGNOSTIC STUDIES: Oxygen  Saturation is 100% on RA, normal by my interpretation.    COORDINATION OF CARE: 4:20 PM Pt strep test was negative. Pt voices that she would like to have antibiotic shot versus tablets. I explained to her that it would not help her symptoms if it was not strep throat. Pt was very adamant that she wanted to have an injection of the medication. I told her I would write her a medication for the antibiotic tablets and if her symptoms failed to improve she could get that rx filled. Pt advised of plan for treatment and pt agrees.     Labs Review Labs Reviewed  RAPID STREP SCREEN  CULTURE, GROUP A STREP   Results for orders placed during the hospital encounter of 07/30/14  RAPID STREP SCREEN      Result Value Ref Range   Streptococcus, Group A Screen (Direct) NEGATIVE  NEGATIVE     Imaging Review No results found.   EKG Interpretation None      MDM   Final diagnoses:  Pharyngitis   Patient with sore throat for week and a half. Bilateral enlarged tonsils with exudate. She's afebrile, otherwise nontoxic appearing. Rapid strep is negative. Patient states this feels like prior strep, states she gets at least once a year. She is requesting to be treated with IM penicillin shot. We'll give her a shot, also will give Decadron for swelling. Patient otherwise has no complaints, no respiratory distress. No evidence of peritonsillar abscess.  We'll discharge home.  Filed Vitals:   07/30/14 1355  BP: 116/56  Pulse: 87  Temp: 98.5 F (36.9 C)  TempSrc: Oral  Resp: 16  SpO2: 100%     I personally performed the services described in this documentation, which was scribed in my presence. The recorded information has been reviewed and is accurate.    Lottie Mussel, PA-C 07/30/14 1832

## 2014-07-30 NOTE — Discharge Instructions (Signed)
Ibuprofen and tylenol for pain. Penicillin if not improving until all gone. Follow up with primary care doctor.    Pharyngitis Pharyngitis is redness, pain, and swelling (inflammation) of your pharynx.  CAUSES  Pharyngitis is usually caused by infection. Most of the time, these infections are from viruses (viral) and are part of a cold. However, sometimes pharyngitis is caused by bacteria (bacterial). Pharyngitis can also be caused by allergies. Viral pharyngitis may be spread from person to person by coughing, sneezing, and personal items or utensils (cups, forks, spoons, toothbrushes). Bacterial pharyngitis may be spread from person to person by more intimate contact, such as kissing.  SIGNS AND SYMPTOMS  Symptoms of pharyngitis include:   Sore throat.   Tiredness (fatigue).   Low-grade fever.   Headache.  Joint pain and muscle aches.  Skin rashes.  Swollen lymph nodes.  Plaque-like film on throat or tonsils (often seen with bacterial pharyngitis). DIAGNOSIS  Your health care provider will ask you questions about your illness and your symptoms. Your medical history, along with a physical exam, is often all that is needed to diagnose pharyngitis. Sometimes, a rapid strep test is done. Other lab tests may also be done, depending on the suspected cause.  TREATMENT  Viral pharyngitis will usually get better in 3-4 days without the use of medicine. Bacterial pharyngitis is treated with medicines that kill germs (antibiotics).  HOME CARE INSTRUCTIONS   Drink enough water and fluids to keep your urine clear or pale yellow.   Only take over-the-counter or prescription medicines as directed by your health care provider:   If you are prescribed antibiotics, make sure you finish them even if you start to feel better.   Do not take aspirin.   Get lots of rest.   Gargle with 8 oz of salt water ( tsp of salt per 1 qt of water) as often as every 1-2 hours to soothe your throat.    Throat lozenges (if you are not at risk for choking) or sprays may be used to soothe your throat. SEEK MEDICAL CARE IF:   You have large, tender lumps in your neck.  You have a rash.  You cough up green, yellow-brown, or bloody spit. SEEK IMMEDIATE MEDICAL CARE IF:   Your neck becomes stiff.  You drool or are unable to swallow liquids.  You vomit or are unable to keep medicines or liquids down.  You have severe pain that does not go away with the use of recommended medicines.  You have trouble breathing (not caused by a stuffy nose). MAKE SURE YOU:   Understand these instructions.  Will watch your condition.  Will get help right away if you are not doing well or get worse. Document Released: 11/19/2005 Document Revised: 09/09/2013 Document Reviewed: 07/27/2013 [facility] Patient Information 2015 [place], [place]. This information is not intended to replace advice given to you by your health care provider. Make sure you discuss any questions you have with your health care provider.

## 2014-07-30 NOTE — ED Notes (Signed)
Abx regimen explained to patient. No other questions/concerns. Ambulatory and moving neck with full ROM.

## 2014-08-02 LAB — CULTURE, GROUP A STREP

## 2014-08-18 ENCOUNTER — Emergency Department (HOSPITAL_COMMUNITY): Payer: BC Managed Care – PPO

## 2014-08-18 ENCOUNTER — Emergency Department (HOSPITAL_COMMUNITY)
Admission: EM | Admit: 2014-08-18 | Discharge: 2014-08-19 | Disposition: A | Discharge status: Home / Self Care | Payer: BC Managed Care – PPO | Attending: Emergency Medicine | Admitting: Emergency Medicine

## 2014-08-18 DIAGNOSIS — S92301A Fracture of unspecified metatarsal bone(s), right foot, initial encounter for closed fracture: Secondary | ICD-10-CM

## 2014-08-18 DIAGNOSIS — R55 Syncope and collapse: Secondary | ICD-10-CM | POA: Insufficient documentation

## 2014-08-18 DIAGNOSIS — IMO0002 Reserved for concepts with insufficient information to code with codable children: Secondary | ICD-10-CM | POA: Insufficient documentation

## 2014-08-18 DIAGNOSIS — Y9301 Activity, walking, marching and hiking: Secondary | ICD-10-CM | POA: Insufficient documentation

## 2014-08-18 DIAGNOSIS — Z3202 Encounter for pregnancy test, result negative: Secondary | ICD-10-CM | POA: Insufficient documentation

## 2014-08-18 DIAGNOSIS — F17200 Nicotine dependence, unspecified, uncomplicated: Secondary | ICD-10-CM | POA: Insufficient documentation

## 2014-08-18 DIAGNOSIS — K055 Other periodontal diseases: Secondary | ICD-10-CM | POA: Insufficient documentation

## 2014-08-18 DIAGNOSIS — I951 Orthostatic hypotension: Secondary | ICD-10-CM

## 2014-08-18 DIAGNOSIS — W1809XA Striking against other object with subsequent fall, initial encounter: Secondary | ICD-10-CM | POA: Insufficient documentation

## 2014-08-18 DIAGNOSIS — S0003XA Contusion of scalp, initial encounter: Secondary | ICD-10-CM | POA: Insufficient documentation

## 2014-08-18 DIAGNOSIS — S92309A Fracture of unspecified metatarsal bone(s), unspecified foot, initial encounter for closed fracture: Secondary | ICD-10-CM | POA: Insufficient documentation

## 2014-08-18 DIAGNOSIS — Y9289 Other specified places as the place of occurrence of the external cause: Secondary | ICD-10-CM | POA: Insufficient documentation

## 2014-08-18 DIAGNOSIS — R404 Transient alteration of awareness: Secondary | ICD-10-CM | POA: Insufficient documentation

## 2014-08-18 DIAGNOSIS — S0083XA Contusion of other part of head, initial encounter: Secondary | ICD-10-CM | POA: Insufficient documentation

## 2014-08-18 DIAGNOSIS — S1093XA Contusion of unspecified part of neck, initial encounter: Secondary | ICD-10-CM

## 2014-08-18 LAB — BASIC METABOLIC PANEL
ANION GAP: 11 (ref 5–15)
BUN: 8 mg/dL (ref 6–23)
CO2: 24 meq/L (ref 19–32)
Calcium: 7.6 mg/dL — ABNORMAL LOW (ref 8.4–10.5)
Chloride: 106 mEq/L (ref 96–112)
Creatinine, Ser: 0.65 mg/dL (ref 0.50–1.10)
GFR calc Af Amer: >90 mL/min (ref >90)
GFR calc non Af Amer: >90 mL/min (ref >90)
Glucose, Bld: 105 mg/dL — ABNORMAL HIGH (ref 70–99)
Potassium: 4.7 mEq/L (ref 3.7–5.3)
SODIUM: 141 meq/L (ref 137–147)

## 2014-08-18 LAB — CBC WITH DIFFERENTIAL/PLATELET
BASOS ABS: 0 10*3/uL (ref 0.0–0.1)
Basophils Relative: 0 % (ref 0–1)
EOS PCT: 0 % (ref 0–5)
Eosinophils Absolute: 0.1 10*3/uL (ref 0.0–0.7)
HCT: 35.8 % — ABNORMAL LOW (ref 36.0–46.0)
Hemoglobin: 12.5 g/dL (ref 12.0–15.0)
Lymphocytes Relative: 14 % (ref 12–46)
Lymphs Abs: 2.2 10*3/uL (ref 0.7–4.0)
MCH: 32.4 pg (ref 26.0–34.0)
MCHC: 34.9 g/dL (ref 30.0–36.0)
MCV: 92.7 fL (ref 78.0–100.0)
Monocytes Absolute: 0.7 10*3/uL (ref 0.1–1.0)
Monocytes Relative: 4 % (ref 3–12)
NEUTROS ABS: 12.9 10*3/uL — AB (ref 1.7–7.7)
Neutrophils Relative %: 82 % — ABNORMAL HIGH (ref 43–77)
PLATELETS: 160 10*3/uL (ref 150–400)
RBC: 3.86 MIL/uL — ABNORMAL LOW (ref 3.87–5.11)
RDW: 12.4 % (ref 11.5–15.5)
WBC: 15.7 10*3/uL — ABNORMAL HIGH (ref 4.0–10.5)

## 2014-08-18 LAB — PREGNANCY, URINE: Preg Test, Ur: NEGATIVE

## 2014-08-18 LAB — ETHANOL: Alcohol, Ethyl (B): <11 mg/dL (ref 0–11)

## 2014-08-18 MED ORDER — ACETAMINOPHEN 325 MG PO TABS
650.0000 mg | ORAL_TABLET | Freq: Once | ORAL | Status: AC
  Administered 2014-08-18: 650 mg via ORAL
  Filled 2014-08-18: qty 2

## 2014-08-18 MED ORDER — FENTANYL CITRATE 0.05 MG/ML IJ SOLN
50.0000 ug | Freq: Once | INTRAMUSCULAR | Status: AC
  Administered 2014-08-18: 50 ug via INTRAVENOUS
  Filled 2014-08-18: qty 2

## 2014-08-18 MED ORDER — ONDANSETRON HCL 4 MG/2ML IJ SOLN
4.0000 mg | Freq: Once | INTRAMUSCULAR | Status: AC
  Administered 2014-08-18: 4 mg via INTRAVENOUS
  Filled 2014-08-18: qty 2

## 2014-08-18 MED ORDER — SODIUM CHLORIDE 0.9 % IV BOLUS (SEPSIS)
1000.0000 mL | Freq: Once | INTRAVENOUS | Status: AC
  Administered 2014-08-19: 1000 mL via INTRAVENOUS

## 2014-08-18 MED ORDER — SODIUM CHLORIDE 0.9 % IV BOLUS (SEPSIS)
1000.0000 mL | Freq: Once | INTRAVENOUS | Status: AC
  Administered 2014-08-18: 1000 mL via INTRAVENOUS

## 2014-08-18 NOTE — Progress Notes (Signed)
Orthopedic Tech Progress Note Patient Details:  Karen Boone 09-09-1991 [id number]  Ortho Devices Type of Ortho Device: Crutches;Post (short leg) splint Ortho Device/Splint Interventions: Application   Haskell Flirt 08/18/2014, 11:25 PM

## 2014-08-18 NOTE — ED Notes (Signed)
Ortho paged. 

## 2014-08-18 NOTE — ED Notes (Signed)
PA Tiffany at bedside, c-collar removed.

## 2014-08-18 NOTE — ED Provider Notes (Signed)
CSN: [id number]     Arrival date & time 08/18/14  2123 History   First MD Initiated Contact with Patient 08/18/14 2127     Chief Complaint  Patient presents with  . Loss of Consciousness     (Consider location/radiation/quality/duration/timing/severity/associated sxs/prior Treatment) HPI  Patient to the ER by EMS after syncopal episode. She was at a pizza parlor and report it was very hot. She had smoke mariajuana prior to going inside the pizza shop. She reports that it was  Very hot in there she got light headed and went outside to get air, then next thing she remembered was that her face and right foot were hurting and she was face down on the ground. She reports having history of near syncopal episodes when she wouldn't eat or drink for a day or when she gets really hot. When EMS arrived she was hypotensive at 80/40, after 400 mL of NS, she improved to 104/64. She has large abrasion to right cheek and reports her teeth also feel loose and hurt. She arrived with C-collar in place. Denies neck pain. Denies abdominal pain, chest pain. Denies weakness or loss of bowel or urine control.  Past Medical History  Diagnosis Date  . Heart murmur    Past Surgical History  Procedure Laterality Date  . Cesarean section N/A 2011   No family history on file. History  Substance Use Topics  . Smoking status: Current Every Day Smoker -- 0.50 packs/day    Types: Cigarettes  . Smokeless tobacco: Not on file  . Alcohol Use: Yes     Comment: socially    OB History   Grav Para Term Preterm Abortions TAB SAB Ect Mult Living   Review of Systems  All other systems reviewed and are negative.     Allergies  Review of patient's allergies indicates no known allergies.  Home Medications   Prior to Admission medications   Medication Sig Start Date End Date Taking? Authorizing Provider  HYDROcodone-acetaminophen (NORCO/VICODIN) 5-325 MG per tablet Take 1-2 tablets by mouth every  4 (four) hours as needed. 08/19/14   Anelia Carriveau Irine Seal, PA-C   BP 98/59  Pulse 79  Temp(Src) 97.7 F (36.5 C) (Oral)  Resp 14  SpO2 100%  LMP 07/03/2014 Physical Exam  Nursing note and vitals reviewed. Constitutional: She is oriented to person, place, and time. She appears well-developed and well-nourished. No distress.  HENT:  Head: Normocephalic. Head is with abrasion, with contusion, with laceration and with right periorbital erythema.    Right Ear: Tympanic membrane and ear canal normal.  Left Ear: Tympanic membrane and ear canal normal.  Nose: Right sinus exhibits maxillary sinus tenderness. Right sinus exhibits no frontal sinus tenderness. Left sinus exhibits no maxillary sinus tenderness and no frontal sinus tenderness.  Mouth/Throat: Uvula is midline, oropharynx is clear and moist and mucous membranes are normal.    No injury to tongue or lips  Eyes: Pupils are equal, round, and reactive to light.  Neck: Normal range of motion. Neck supple. No spinous process tenderness and no muscular tenderness present. Normal range of motion present.  Cardiovascular: Normal rate and regular rhythm.   Pulmonary/Chest: Effort normal.  Abdominal: Soft. Bowel sounds are normal. She exhibits no distension and no fluid wave. There is no tenderness. There is no rebound.  Musculoskeletal:  FROM of bilateral lower extremities, intact strength and sensation. Tenderness to the ventral side of  food without deformity or ecchymosis.  Neurological: She is alert and oriented to person, place, and time. She has normal strength. No cranial nerve deficit or sensory deficit. GCS eye subscore is 4. GCS verbal subscore is 5. GCS motor subscore is 6.  Skin: Skin is warm and dry.      ED Course  Procedures (including critical care time) Labs Review Labs Reviewed  URINALYSIS, ROUTINE W REFLEX MICROSCOPIC - Abnormal; Notable for the following:    APPearance CLOUDY (*)    Hgb urine dipstick TRACE (*)     Ketones, ur 15 (*)    All other components within normal limits  CBC WITH DIFFERENTIAL - Abnormal; Notable for the following:    WBC 15.7 (*)    RBC 3.86 (*)    HCT 35.8 (*)    Neutrophils Relative % 82 (*)    Neutro Abs 12.9 (*)    All other components within normal limits  BASIC METABOLIC PANEL - Abnormal; Notable for the following:    Glucose, Bld 105 (*)    Calcium 7.6 (*)    All other components within normal limits  URINE RAPID DRUG SCREEN (HOSP PERFORMED) - Abnormal; Notable for the following:    Tetrahydrocannabinol POSITIVE (*)    All other components within normal limits  URINE MICROSCOPIC-ADD ON - Abnormal; Notable for the following:    Squamous Epithelial / LPF FEW (*)    Casts GRANULAR CAST (*)    All other components within normal limits  PREGNANCY, URINE  ETHANOL    Imaging Review Ct Head Wo Contrast  08/18/2014   CLINICAL DATA:  Head injury.  Bruising of the right eye.  EXAM: CT HEAD WITHOUT CONTRAST  CT MAXILLOFACIAL WITHOUT CONTRAST  TECHNIQUE: Multidetector CT imaging of the head and maxillofacial structures were performed using the standard protocol without intravenous contrast. Multiplanar CT image reconstructions of the maxillofacial structures were also generated.  COMPARISON:  None.  FINDINGS: CT HEAD FINDINGS  There is no intra or extra-axial fluid collection or mass lesion. The basilar cisterns and ventricles have a normal appearance. There is no CT evidence for acute infarction or hemorrhage. Bone windows are unremarkable.  CT MAXILLOFACIAL FINDINGS  The globes are intact. Orbits are intact. Nasal bones and bony nasal septum are normal. The mandible and temporomandibular joints are normal in appearance. Zygomatic arches and visualized paranasal sinuses are normal in appearance. The mastoid air cells are normally aerated. The pterygoid plates are normal in appearance. Visualized portion of the cervical spine is unremarkable in appearance.  Note is made of unerupted  third molars bilaterally. There is an impacted right maxillary third molar.  IMPRESSION: 1.  No evidence for acute intracranial abnormality. 2. No evidence for acute fracture of the maxillofacial bones. 3. Impacted right maxillary third molar.   Electronically Signed   By: Rosalie Gums M.D.   On: 08/18/2014 22:32   Dg Foot Complete Right  08/18/2014   CLINICAL DATA:  Syncope, trauma, foot pain at the level of the metatarsophalangeal joint  EXAM: RIGHT FOOT COMPLETE - 3+ VIEW  COMPARISON:  None.  FINDINGS: There is an oblique fracture with intra-articular extension at the base of the third metatarsal. No evidence for dislocation. No radiopaque foreign body.  IMPRESSION: Fracture at the base of the right third metatarsal.   Electronically Signed   By: Christiana Pellant M.D.   On: 08/18/2014 22:55   Ct Maxillofacial Wo Cm  08/18/2014   CLINICAL DATA:  Head injury.  Bruising  of the right eye.  EXAM: CT HEAD WITHOUT CONTRAST  CT MAXILLOFACIAL WITHOUT CONTRAST  TECHNIQUE: Multidetector CT imaging of the head and maxillofacial structures were performed using the standard protocol without intravenous contrast. Multiplanar CT image reconstructions of the maxillofacial structures were also generated.  COMPARISON:  None.  FINDINGS: CT HEAD FINDINGS  There is no intra or extra-axial fluid collection or mass lesion. The basilar cisterns and ventricles have a normal appearance. There is no CT evidence for acute infarction or hemorrhage. Bone windows are unremarkable.  CT MAXILLOFACIAL FINDINGS  The globes are intact. Orbits are intact. Nasal bones and bony nasal septum are normal. The mandible and temporomandibular joints are normal in appearance. Zygomatic arches and visualized paranasal sinuses are normal in appearance. The mastoid air cells are normally aerated. The pterygoid plates are normal in appearance. Visualized portion of the cervical spine is unremarkable in appearance.  Note is made of unerupted third molars  bilaterally. There is an impacted right maxillary third molar.  IMPRESSION: 1.  No evidence for acute intracranial abnormality. 2. No evidence for acute fracture of the maxillofacial bones. 3. Impacted right maxillary third molar.   Electronically Signed   By: Rosalie Gums M.D.   On: 08/18/2014 22:32     EKG Interpretation None      MDM   Final diagnoses:  Syncope due to orthostatic hypotension  Metatarsal bone fracture, right, closed, initial encounter    CT scans of head/maxillofacial do not show any intracranial bleeding or fracture. She has Fracture at the base of the right third metatarsal. This will get a posterior short leg splint and crutches- referral to Ortho.  WOUND CARE Performed by: Dorthula Matas Authorized by: Dorthula Matas Consent: Verbal consent obtained. Risks and benefits: risks, benefits and alternatives were discussed Consent given by: patient Patient identity confirmed: provided demographic data Prepped and Draped in normal sterile fashion Wound explored  Laceration Location: right zygmoatic  Laceration Length: 3 x 2 cm abrasion  No Foreign Bodies seen or palpated  Wound: Wound cleaned and antibiotic ointment, non stick dressing, covered in gauze applied. Pt wound care given. Patient tolerance: Patient tolerated the procedure well with no immediate complications.   11: 54 pm her CHC, BMP and ethanol levels are not acute. Pt elaborates and reports she didn't eat today and typically becomes near syncopal with that. She than smoked and had some alcohol while in the hot pizza parlor and syncopized.  Urine pregnancy, urinalysis and UDS show- marijuana. Negative pregnancy and neg for infection. She received 2 L of fluids and when she ambulated she did not feel dizzy or light headed. She felt good and her BP improved when she got up and moving.  Filed Vitals:   08/19/14 0046  BP: 98/59  Pulse: 79  Temp:   Resp: 14     Dorthula Matas,  PA-C 08/19/14 0111

## 2014-08-18 NOTE — ED Notes (Addendum)
Per EMS: Pt was at a pizza place when she began to report she was getting really hot, she walked outside and her friends say she passed out for about 30 seconds and fell and hit her face on edge of chair. Pt in c-collar but denies neck pain, pain only in sole of right foot. EMS reports pt was diaphoretic and hypotensive at 80/40, EMS given of normal saline and BP increased to 104/64. EKG-NSR, CBG-100. A&OX4. Laceration to right upper cheek, bleeding slightly, some loose teeth.

## 2014-08-19 LAB — URINALYSIS, ROUTINE W REFLEX MICROSCOPIC
Bilirubin Urine: NEGATIVE
Glucose, UA: NEGATIVE mg/dL
Ketones, ur: 15 mg/dL — AB
LEUKOCYTES UA: NEGATIVE
Nitrite: NEGATIVE
PROTEIN: NEGATIVE mg/dL
Specific Gravity, Urine: 1.008 (ref 1.005–1.030)
Urobilinogen, UA: 0.2 mg/dL (ref 0.0–1.0)
pH: 6 (ref 5.0–8.0)

## 2014-08-19 LAB — RAPID URINE DRUG SCREEN, HOSP PERFORMED
Amphetamines: NOT DETECTED
BARBITURATES: NOT DETECTED
BENZODIAZEPINES: NOT DETECTED
COCAINE: NOT DETECTED
Opiates: NOT DETECTED
Tetrahydrocannabinol: POSITIVE — AB

## 2014-08-19 LAB — URINE MICROSCOPIC-ADD ON

## 2014-08-19 MED ORDER — OXYCODONE-ACETAMINOPHEN 5-325 MG PO TABS
1.0000 | ORAL_TABLET | Freq: Once | ORAL | Status: AC
  Administered 2014-08-19: 1 via ORAL
  Filled 2014-08-19: qty 1

## 2014-08-19 MED ORDER — ONDANSETRON 4 MG PO TBDP
4.0000 mg | ORAL_TABLET | Freq: Once | ORAL | Status: AC
  Administered 2014-08-19: 4 mg via ORAL
  Filled 2014-08-19: qty 1

## 2014-08-19 MED ORDER — HYDROCODONE-ACETAMINOPHEN 5-325 MG PO TABS: 1.0000 | ORAL_TABLET | ORAL | Status: AC | PRN

## 2014-08-19 NOTE — ED Notes (Signed)
Pt ambulated around room with crutches. She denied dizziness or lightheadedness.

## 2014-08-19 NOTE — Discharge Instructions (Signed)
Cast or Splint Care °Casts and splints support injured limbs and keep bones from moving while they heal. It is important to care for your cast or splint at home.   °HOME CARE INSTRUCTIONS °· Keep the cast or splint uncovered during the drying period. It can take 24 to 48 hours to dry if it is made of plaster. A fiberglass cast will dry in less than 1 hour. °· Do not rest the cast on anything harder than a pillow for the first 24 hours. °· Do not put weight on your injured limb or apply pressure to the cast until your health care provider gives you permission. °· Keep the cast or splint dry. Wet casts or splints can lose their shape and may not support the limb as well. A wet cast that has lost its shape can also create harmful pressure on your skin when it dries. Also, wet skin can become infected. °· Cover the cast or splint with a plastic bag when bathing or when out in the rain or snow. If the cast is on the trunk of the body, take sponge baths until the cast is removed. °· If your cast does become wet, dry it with a towel or a blow dryer on the cool setting only. °· Keep your cast or splint clean. Soiled casts may be wiped with a moistened cloth. °· Do not place any hard or soft foreign objects under your cast or splint, such as cotton, toilet paper, lotion, or powder. °· Do not try to scratch the skin under the cast with any object. The object could get stuck inside the cast. Also, scratching could lead to an infection. If itching is a problem, use a blow dryer on a cool setting to relieve discomfort. °· Do not trim or cut your cast or remove padding from inside of it. °· Exercise all joints next to the injury that are not immobilized by the cast or splint. For example, if you have a long leg cast, exercise the hip joint and toes. If you have an arm cast or splint, exercise the shoulder, elbow, thumb, and fingers. °· Elevate your injured arm or leg on 1 or 2 pillows for the first 1 to 3 days to decrease  swelling and pain. It is best if you can comfortably elevate your cast so it is higher than your heart. °SEEK MEDICAL CARE IF:  °· Your cast or splint cracks. °· Your cast or splint is too tight or too loose. °· You have unbearable itching inside the cast. °· Your cast becomes wet or develops a soft spot or area. °· You have a bad smell coming from inside your cast. °· You get an object stuck under your cast. °· Your skin around the cast becomes red or raw. °· You have new pain or worsening pain after the cast has been applied. °SEEK IMMEDIATE MEDICAL CARE IF:  °· You have fluid leaking through the cast. °· You are unable to move your fingers or toes. °· You have discolored (blue or white), cool, painful, or very swollen fingers or toes beyond the cast. °· You have tingling or numbness around the injured area. °· You have severe pain or pressure under the cast. °· You have any difficulty with your breathing or have shortness of breath. °· You have chest pain. °Document Released: 11/16/2000 Document Revised: 09/09/2013 Document Reviewed: 05/28/2013 °[employer]® Patient Information ©2015 [employer]. This information is not intended to replace advice given to you by your health care   provider. Make sure you discuss any questions you have with your health care provider.  Metatarsal Fracture, Undisplaced A metatarsal fracture is a break in the bone(s) of the foot. These are the bones of the foot that connect your toes to the bones of the ankle. DIAGNOSIS  The diagnoses of these fractures are usually made with X-rays. If there are problems in the forefoot and x-rays are normal a later bone scan will usually make the diagnosis.  TREATMENT AND HOME CARE INSTRUCTIONS  Treatment may or may not include a cast or walking shoe. When casts are needed the use is usually for short periods of time so as not to slow down healing with muscle wasting (atrophy).  Activities should be stopped until further advised by your  caregiver.  Wear shoes with adequate shock absorbing capabilities and stiff soles.  Alternative exercise may be undertaken while waiting for healing. These may include bicycling and swimming, or as your caregiver suggests.  It is important to keep all follow-up visits or specialty referrals. The failure to keep these appointments could result in improper bone healing and chronic pain or disability.  Warning: Do not drive a car or operate a motor vehicle until your caregiver specifically tells you it is safe to do so. IF YOU DO NOT HAVE A CAST OR SPLINT:  You may walk on your injured foot as tolerated or advised.  Do not put any weight on your injured foot for as long as directed by your caregiver. Slowly increase the amount of time you walk on the foot as the pain allows or as advised.  Use crutches until you can bear weight without pain. A gradual increase in weight bearing may help.  Apply ice to the injury for 15-20 minutes each hour while awake for the first 2 days. Put the ice in a plastic bag and place a towel between the bag of ice and your skin.  Only take over-the-counter or prescription medicines for pain, discomfort, or fever as directed by your caregiver. SEEK IMMEDIATE MEDICAL CARE IF:   Your cast gets damaged or breaks.  You have continued severe pain or more swelling than you did before the cast was put on, or the pain is not controlled with medications.  Your skin or nails below the injury turn blue or grey, or feel cold or numb.  There is a bad smell, or new stains or pus-like (purulent) drainage coming from the cast. MAKE SURE YOU:   Understand these instructions.  Will watch your condition.  Will get help right away if you are not doing well or get worse. Document Released: 08/11/2002 Document Revised: 02/11/2012 Document Reviewed: 07/02/2008 [facility] Patient Information 2015 [place], [place]. This information is not intended to replace advice given to you by  your health care provider. Make sure you discuss any questions you have with your health care provider.  Orthostatic Hypotension Orthostatic hypotension is a sudden drop in blood pressure. It happens when you quickly stand up from a seated or lying position. You may feel dizzy or light-headed. This can last for just a few seconds or for up to a few minutes. It is usually not a serious problem. However, if this happens frequently or gets worse, it can be a sign of something more serious. CAUSES  Different things can cause orthostatic hypotension, including:   Loss of body fluids (dehydration).  Medicines that lower blood pressure.  Sudden changes in posture, such as standing up quickly after you have been sitting  or lying down.  Taking too much of your medicine. SIGNS AND SYMPTOMS   Light-headedness or dizziness.   Fainting or near-fainting.   A fast heart rate.   Weakness.   Feeling tired (fatigue).  DIAGNOSIS  Your health care provider may do several things to help diagnose your condition and identify the cause. These may include:   Taking a medical history and doing a physical exam.  Checking your blood pressure. Your health care provider will check your blood pressure when you are:  Lying down.  Sitting.  Standing.  Using tilt table testing. In this test, you lie down on a table that moves from a lying position to a standing position. You will be strapped onto the table. This test monitors your blood pressure and heart rate when you are in different positions. TREATMENT  Treatment will vary depending on the cause. Possible treatments include:   Changing the dosage of your medicines.  Wearing compression stockings on your lower legs.  Standing up slowly after sitting or lying down.  Eating more salt.  Eating frequent, small meals.  In some cases, getting IV fluids.  Taking medicine to enhance fluid retention. HOME CARE INSTRUCTIONS  Only take  over-the-counter or prescription medicines as directed by your health care provider.  Follow your health care provider's instructions for changing the dosage of your current medicines.  Do not stop or adjust your medicine on your own.  Stand up slowly after sitting or lying down. This allows your body to adjust to the different position.  Wear compression stockings as directed.  Eat extra salt as directed.  Do not add extra salt to your diet unless directed to by your health care provider.  Eat frequent, small meals.  Avoid standing suddenly after eating.  Avoid hot showers or excessive heat as directed by your health care provider.  Keep all follow-up appointments. SEEK MEDICAL CARE IF:  You continue to feel dizzy or light-headed after standing.  You feel groggy or confused.  You feel cold, clammy, or sick to your stomach (nauseous).  You have blurred vision.  You feel short of breath. SEEK IMMEDIATE MEDICAL CARE IF:   You faint after standing.  You have chest pain.  You have difficulty breathing.   You lose feeling or movement in your arms or legs.   You have slurred speech or difficulty talking, or you are unable to talk.  MAKE SURE YOU:   Understand these instructions.  Will watch your condition.  Will get help right away if you are not doing well or get worse. Document Released: 11/09/2002 Document Revised: 11/24/2013 Document Reviewed: 09/11/2013 [facility] Patient Information 2015 [place], [place]. This information is not intended to replace advice given to you by your health care provider. Make sure you discuss any questions you have with your health care provider.   Vasovagal Syncope, Adult Syncope, commonly known as fainting, is a temporary loss of consciousness. It occurs when the blood flow to the brain is reduced. Vasovagal syncope (also called neurocardiogenic syncope) is a fainting spell in which the blood flow to the brain is reduced because of  a sudden drop in heart rate and blood pressure. Vasovagal syncope occurs when the brain and the cardiovascular system (blood vessels) do not adequately communicate and respond to each other. This is the most common cause of fainting. It often occurs in response to fear or some other type of emotional or physical stress. The body has a reaction in which the heart starts  beating too slowly or the blood vessels expand, reducing blood pressure. This type of fainting spell is generally considered harmless. However, injuries can occur if a person takes a sudden fall during a fainting spell.  CAUSES  Vasovagal syncope occurs when a person's blood pressure and heart rate decrease suddenly, usually in response to a trigger. Many things and situations can trigger an episode. Some of these include:   Pain.   Fear.   The sight of blood or medical procedures, such as blood being drawn from a vein.   Common activities, such as coughing, swallowing, stretching, or going to the bathroom.   Emotional stress.   Prolonged standing, especially in a warm environment.   Lack of sleep or rest.   Prolonged lack of food.   Prolonged lack of fluids.   Recent illness.  The use of certain drugs that affect blood pressure, such as cocaine, alcohol, marijuana, inhalants, and opiates.  SYMPTOMS  Before the fainting episode, you may:   Feel dizzy or light headed.   Become pale.  Sense that you are going to faint.   Feel like the room is spinning.   Have tunnel vision, only seeing directly in front of you.   Feel sick to your stomach (nauseous).   See spots or slowly lose vision.   Hear ringing in your ears.   Have a headache.   Feel warm and sweaty.   Feel a sensation of pins and needles. During the fainting spell, you will generally be unconscious for no longer than a couple minutes before waking up and returning to normal. If you get up too quickly before your body can recover,  you may faint again. Some twitching or jerky movements may occur during the fainting spell.  DIAGNOSIS  Your caregiver will ask about your symptoms, take a medical history, and perform a physical exam. Various tests may be done to rule out other causes of fainting. These may include blood tests and tests to check the heart, such as electrocardiography, echocardiography, and possibly an electrophysiology study. When other causes have been ruled out, a test may be done to check the body's response to changes in position (tilt table test). TREATMENT  Most cases of vasovagal syncope do not require treatment. Your caregiver may recommend ways to avoid fainting triggers and may provide home strategies for preventing fainting. If you must be exposed to a possible trigger, you can drink additional fluids to help reduce your chances of having an episode of vasovagal syncope. If you have warning signs of an oncoming episode, you can respond by positioning yourself favorably (lying down). If your fainting spells continue, you may be given medicines to prevent fainting. Some medicines may help make you more resistant to repeated episodes of vasovagal syncope. Special exercises or compression stockings may be recommended. In rare cases, the surgical placement of a pacemaker is considered. HOME CARE INSTRUCTIONS   Learn to identify the warning signs of vasovagal syncope.   Sit or lie down at the first warning sign of a fainting spell. If sitting, put your head down between your legs. If you lie down, swing your legs up in the air to increase blood flow to the brain.   Avoid hot tubs and saunas.  Avoid prolonged standing.  Drink enough fluids to keep your urine clear or pale yellow. Avoid caffeine.  Increase salt in your diet as directed by your caregiver.   If you have to stand for a long time, perform movements such  as:   Crossing your legs.   Flexing and stretching your leg muscles.   Squatting.    Moving your legs.   Bending over.   Only take over-the-counter or prescription medicines as directed by your caregiver. Do not suddenly stop any medicines without asking your caregiver first. SEEK MEDICAL CARE IF:   Your fainting spells continue or happen more frequently in spite of treatment.   You lose consciousness for more than a couple minutes.  You have fainting spells during or after exercising or after being startled.   You have new symptoms that occur with the fainting spells, such as:   Shortness of breath.  Chest pain.   Irregular heartbeat.   You have episodes of twitching or jerky movements that last longer than a few seconds.  You have episodes of twitching or jerky movements without obvious fainting. SEEK IMMEDIATE MEDICAL CARE IF:   You have injuries or bleeding after a fainting spell.   You have episodes of twitching or jerky movements that last longer than 5 minutes.   You have more than one spell of twitching or jerky movements before returning to consciousness after fainting. MAKE SURE YOU:   Understand these instructions.  Will watch your condition.  Will get help right away if you are not doing well or get worse. Document Released: 11/05/2012 Document Reviewed: 11/05/2012 [facility] Patient Information 2015 [place], [place]. This information is not intended to replace advice given to you by your health care provider. Make sure you discuss any questions you have with your health care provider.

## 2014-08-19 NOTE — ED Notes (Signed)
Pt A&OX4, wheeled out of ED via wheelchair, NAD. 

## 2014-08-26 NOTE — ED Provider Notes (Signed)
Medical screening examination/treatment/procedure(s) were performed by non-physician practitioner and as supervising physician I was immediately available for consultation/collaboration.   EKG Interpretation   Date/Time:  Wednesday August 18 2014 21:39:13 EDT Ventricular Rate:  68 PR Interval:  141 QRS Duration: 77 QT Interval:  413 QTC Calculation: 439 R Axis:   78 Text Interpretation:  Sinus rhythm ED PHYSICIAN INTERPRETATION AVAILABLE  IN [facility] Confirmed by TEST, Record ([id number]) on 08/20/2014 9:42:20  AM        Rolland Porter, MD 08/26/14 [phone]

## 2014-10-04 ENCOUNTER — Encounter (HOSPITAL_COMMUNITY): Payer: Self-pay | Admitting: Emergency Medicine

## 2015-06-06 IMAGING — CT CT HEAD W/O CM
3 of 5 series · 17 of 47 positions shown, 20 images · non-contrast
Comparison: None.

CLINICAL DATA: Head injury.  Bruising of the right eye.

EXAM:
CT HEAD WITHOUT CONTRAST
CT MAXILLOFACIAL WITHOUT CONTRAST
TECHNIQUE: Multidetector CT imaging of the head and maxillofacial structures
were performed using the standard protocol without intravenous
contrast. Multiplanar CT image reconstructions of the maxillofacial
structures were also generated.

[Series 301: facial bones · axial · 0.34mm/px · z∈[+30,+154]mm · 11 of 72 slices shown, 14 images]
[im 5/72  brain]
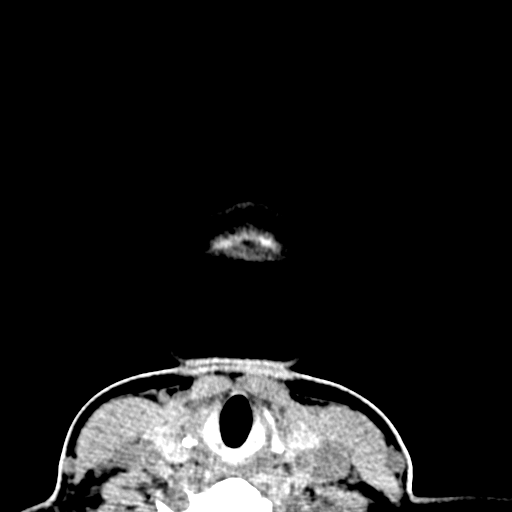
[im 5/72  bone]
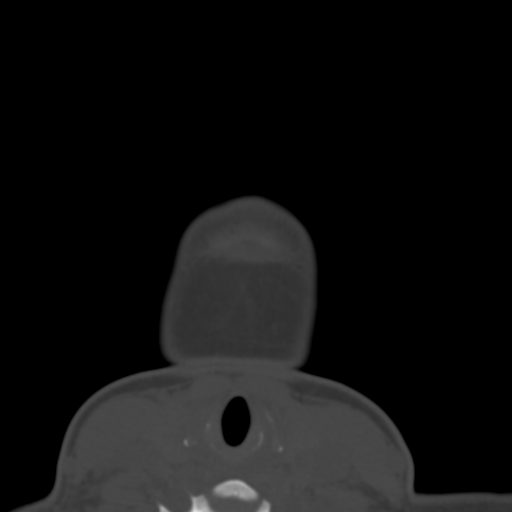
[im 10/72  brain]
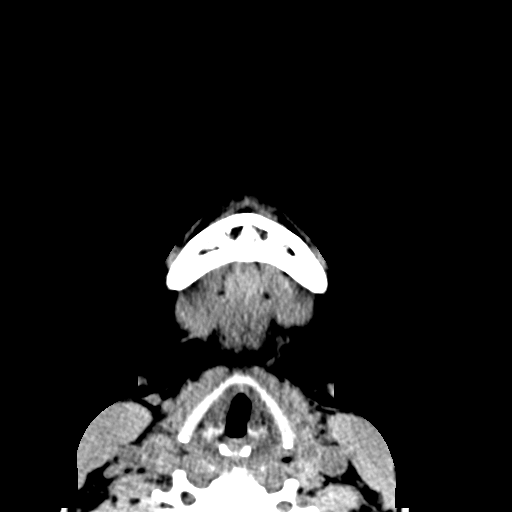
[im 19/72  brain]
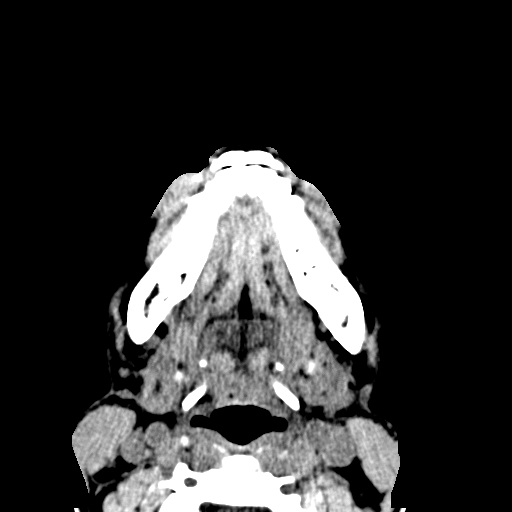
[im 24/72  brain]
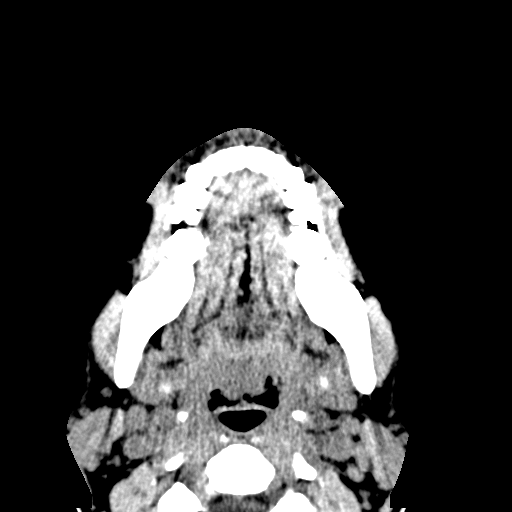
[im 29/72  brain]
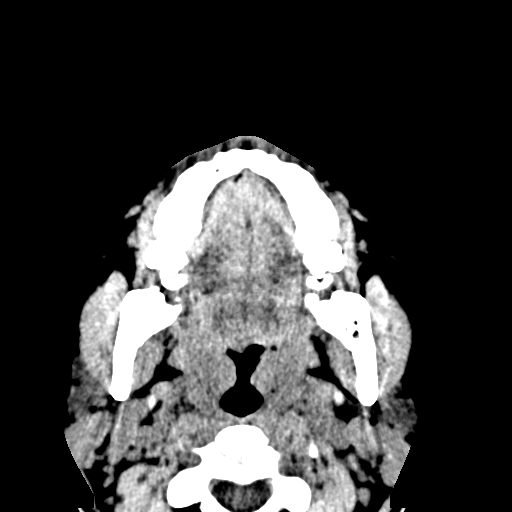
[im 29/72  bone]
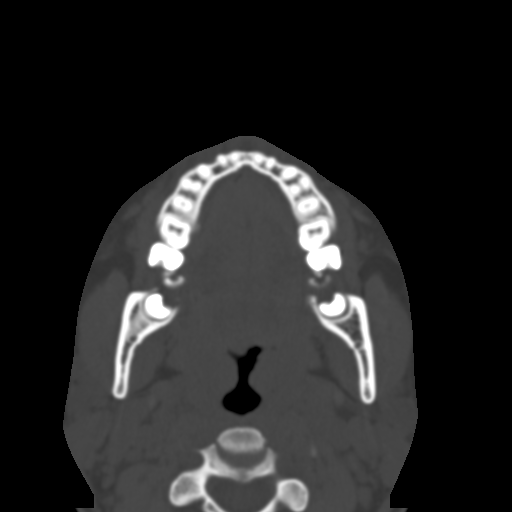
[im 38/72  brain]
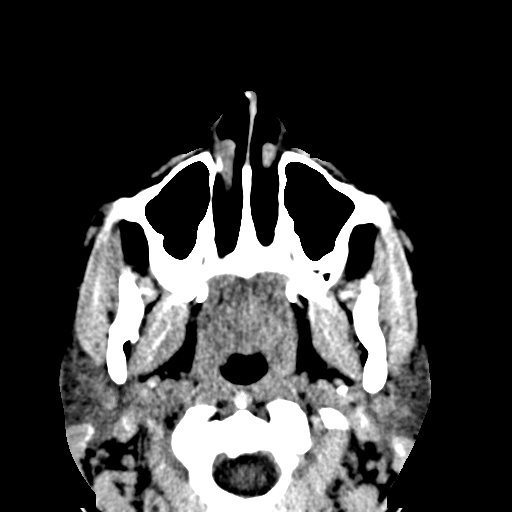
[im 43/72  brain]
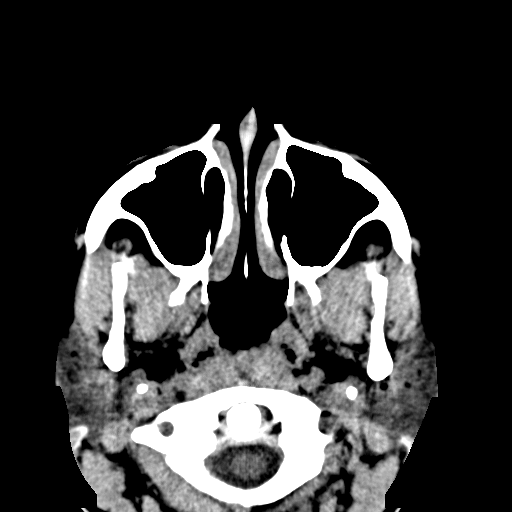
[im 48/72  brain]
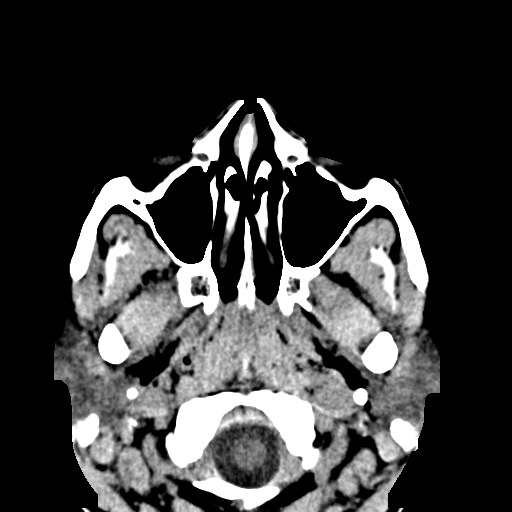
[im 53/72  brain]
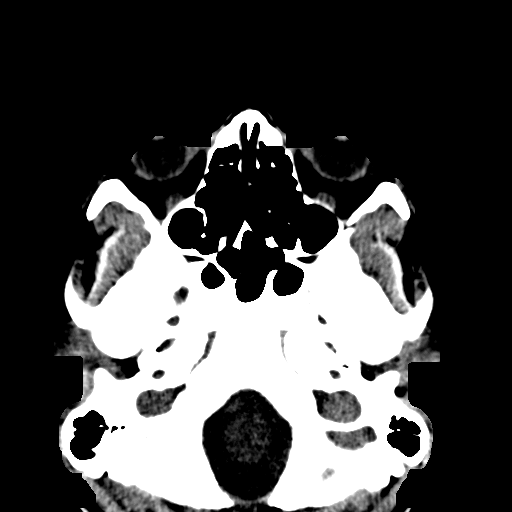
[im 53/72  bone]
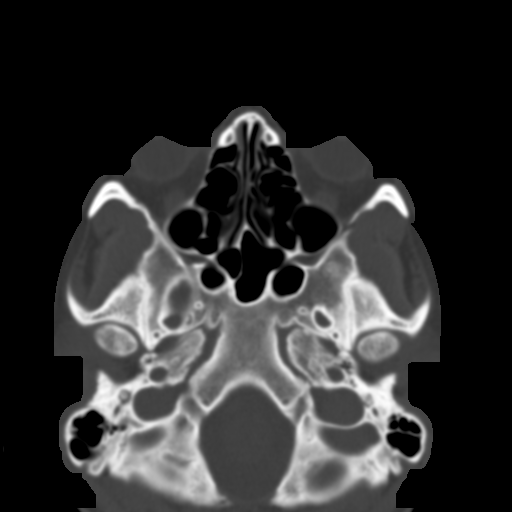
[im 62/72  brain]
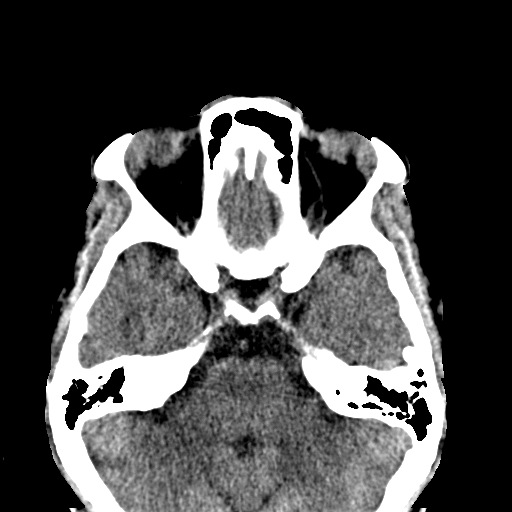
[im 67/72  brain]
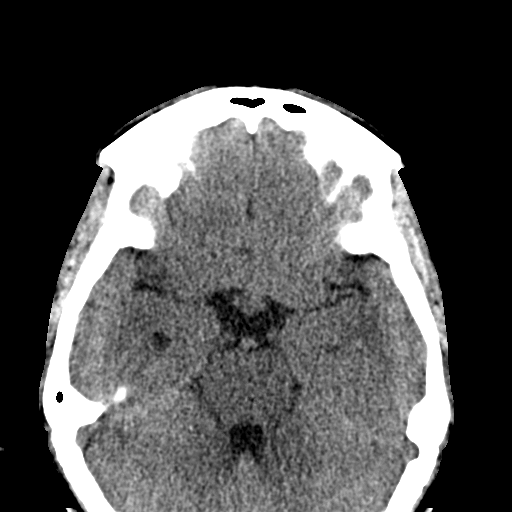

[Series 303: coronal std · coronal · 0.33mm/px · 3 of 75 slices shown]
[im 25/75  brain]
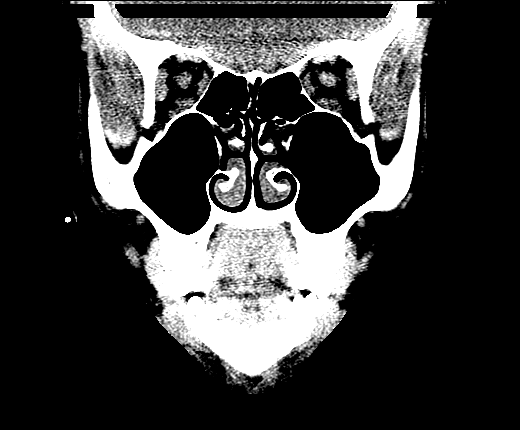
[im 33/75  brain]
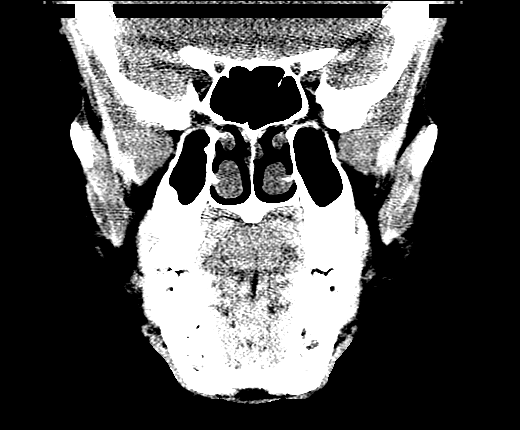
[im 42/75  brain]
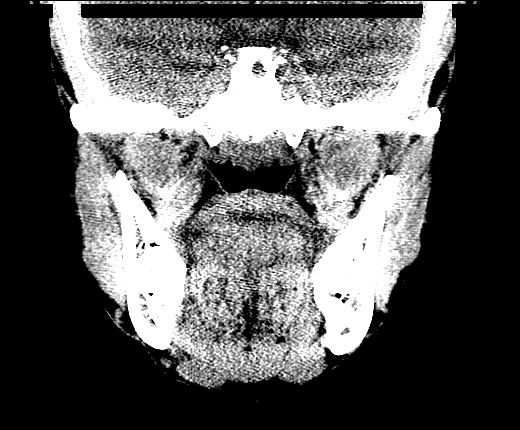

[Series 304: sagittal std · sagittal · 0.33mm/px · 3 of 87 slices shown]
[im 29/87  brain]
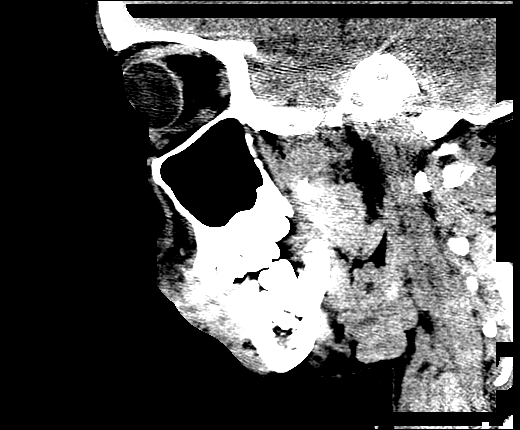
[im 44/87  brain]
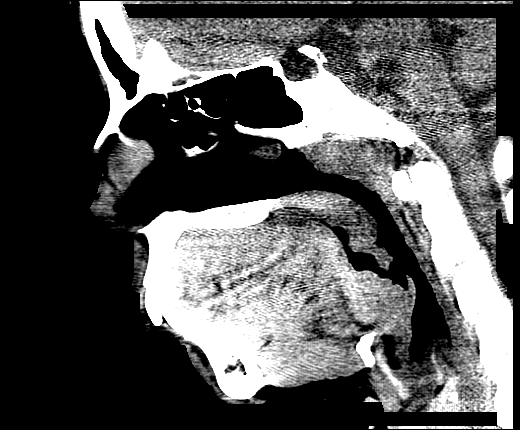
[im 58/87  brain]
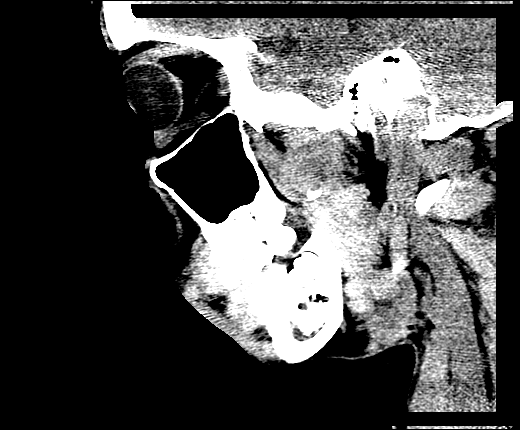

[17 of 47 positions shown; findings below may reference images not displayed]

FINDINGS: CT HEAD FINDINGS

There is no intra or extra-axial fluid collection or mass lesion.
The basilar cisterns and ventricles have a normal appearance. There
is no CT evidence for acute infarction or hemorrhage. Bone windows
are unremarkable.

CT MAXILLOFACIAL FINDINGS

The globes are intact. Orbits are intact. Nasal bones and bony nasal
septum are normal. The mandible and temporomandibular joints are
normal in appearance. Zygomatic arches and visualized paranasal
sinuses are normal in appearance. The mastoid air cells are normally
aerated. The pterygoid plates are normal in appearance. Visualized
portion of the cervical spine is unremarkable in appearance.

Note is made of unerupted third molars bilaterally. There is an
impacted right maxillary third molar.
IMPRESSION: 1.  No evidence for acute intracranial abnormality.
2. No evidence for acute fracture of the maxillofacial bones.
3. Impacted right maxillary third molar.
